# Patient Record
Sex: Male | Born: 1970 | Race: Black or African American | Hispanic: No | Marital: Single | Smoking: Current every day smoker
Health system: Southern US, Community
[De-identification: ages and names within clinical notes are randomized; demographics above are authoritative.]

---

## 2000-12-16 ENCOUNTER — Emergency Department (HOSPITAL_COMMUNITY): Admission: EM | Admit: 2000-12-16 | Discharge: 2000-12-16 | Payer: Self-pay | Admitting: Internal Medicine

## 2000-12-21 ENCOUNTER — Emergency Department (HOSPITAL_COMMUNITY): Admission: EM | Admit: 2000-12-21 | Discharge: 2000-12-21 | Payer: Self-pay | Admitting: Emergency Medicine

## 2000-12-21 ENCOUNTER — Encounter: Payer: Self-pay | Admitting: Emergency Medicine

## 2001-03-01 ENCOUNTER — Emergency Department (HOSPITAL_COMMUNITY): Admission: EM | Admit: 2001-03-01 | Discharge: 2001-03-01 | Payer: Self-pay | Admitting: Emergency Medicine

## 2001-09-12 ENCOUNTER — Emergency Department (HOSPITAL_COMMUNITY): Admission: EM | Admit: 2001-09-12 | Discharge: 2001-09-12 | Payer: Self-pay | Admitting: *Deleted

## 2001-11-21 ENCOUNTER — Emergency Department (HOSPITAL_COMMUNITY): Admission: EM | Admit: 2001-11-21 | Discharge: 2001-11-22 | Payer: Self-pay | Admitting: Emergency Medicine

## 2002-02-06 ENCOUNTER — Encounter: Payer: Self-pay | Admitting: Emergency Medicine

## 2002-02-06 ENCOUNTER — Emergency Department (HOSPITAL_COMMUNITY): Admission: EM | Admit: 2002-02-06 | Discharge: 2002-02-06 | Payer: Self-pay | Admitting: Emergency Medicine

## 2002-02-06 ENCOUNTER — Encounter: Payer: Self-pay | Admitting: Orthopedic Surgery

## 2014-07-24 ENCOUNTER — Emergency Department (HOSPITAL_COMMUNITY): Payer: No Typology Code available for payment source

## 2014-07-24 ENCOUNTER — Inpatient Hospital Stay (HOSPITAL_COMMUNITY)
Admission: EM | Admit: 2014-07-24 | Discharge: 2014-07-28 | DRG: 085 | Disposition: A | Discharge status: Other Acute Inpatient Hospital | Payer: No Typology Code available for payment source | Attending: Surgery | Admitting: Surgery

## 2014-07-24 ENCOUNTER — Encounter (HOSPITAL_COMMUNITY): Payer: Self-pay | Admitting: Emergency Medicine

## 2014-07-24 DIAGNOSIS — F17210 Nicotine dependence, cigarettes, uncomplicated: Secondary | ICD-10-CM | POA: Diagnosis present

## 2014-07-24 DIAGNOSIS — S0591XA Unspecified injury of right eye and orbit, initial encounter: Secondary | ICD-10-CM

## 2014-07-24 DIAGNOSIS — S0219XB Other fracture of base of skull, initial encounter for open fracture: Secondary | ICD-10-CM | POA: Diagnosis present

## 2014-07-24 DIAGNOSIS — S0292XA Unspecified fracture of facial bones, initial encounter for closed fracture: Secondary | ICD-10-CM | POA: Diagnosis present

## 2014-07-24 DIAGNOSIS — S02401B Maxillary fracture, unspecified, initial encounter for open fracture: Secondary | ICD-10-CM | POA: Diagnosis present

## 2014-07-24 DIAGNOSIS — S066X0A Traumatic subarachnoid hemorrhage without loss of consciousness, initial encounter: Principal | ICD-10-CM | POA: Diagnosis present

## 2014-07-24 DIAGNOSIS — S0285XB Fracture of orbit, unspecified, initial encounter for open fracture: Secondary | ICD-10-CM

## 2014-07-24 DIAGNOSIS — S0292XB Unspecified fracture of facial bones, initial encounter for open fracture: Secondary | ICD-10-CM

## 2014-07-24 DIAGNOSIS — Z1889 Other specified retained foreign body fragments: Secondary | ICD-10-CM

## 2014-07-24 DIAGNOSIS — G960 Cerebrospinal fluid leak: Secondary | ICD-10-CM | POA: Diagnosis present

## 2014-07-24 DIAGNOSIS — W3400XA Accidental discharge from unspecified firearms or gun, initial encounter: Secondary | ICD-10-CM

## 2014-07-24 DIAGNOSIS — S0541XA Penetrating wound of orbit with or without foreign body, right eye, initial encounter: Secondary | ICD-10-CM | POA: Diagnosis present

## 2014-07-24 DIAGNOSIS — Z4659 Encounter for fitting and adjustment of other gastrointestinal appliance and device: Secondary | ICD-10-CM

## 2014-07-24 DIAGNOSIS — S023XXB Fracture of orbital floor, initial encounter for open fracture: Secondary | ICD-10-CM | POA: Diagnosis present

## 2014-07-24 DIAGNOSIS — S020XXA Fracture of vault of skull, initial encounter for closed fracture: Secondary | ICD-10-CM | POA: Diagnosis present

## 2014-07-24 DIAGNOSIS — S061X0A Traumatic cerebral edema without loss of consciousness, initial encounter: Secondary | ICD-10-CM | POA: Diagnosis present

## 2014-07-24 DIAGNOSIS — F329 Major depressive disorder, single episode, unspecified: Secondary | ICD-10-CM | POA: Diagnosis not present

## 2014-07-24 DIAGNOSIS — F419 Anxiety disorder, unspecified: Secondary | ICD-10-CM | POA: Diagnosis not present

## 2014-07-24 DIAGNOSIS — H0551 Retained (old) foreign body following penetrating wound of right orbit: Secondary | ICD-10-CM | POA: Diagnosis present

## 2014-07-24 DIAGNOSIS — S0291XA Unspecified fracture of skull, initial encounter for closed fracture: Secondary | ICD-10-CM

## 2014-07-24 DIAGNOSIS — J9600 Acute respiratory failure, unspecified whether with hypoxia or hypercapnia: Secondary | ICD-10-CM | POA: Diagnosis present

## 2014-07-24 DIAGNOSIS — S069XAA Unspecified intracranial injury with loss of consciousness status unknown, initial encounter: Secondary | ICD-10-CM | POA: Diagnosis present

## 2014-07-24 DIAGNOSIS — S069X9A Unspecified intracranial injury with loss of consciousness of unspecified duration, initial encounter: Secondary | ICD-10-CM | POA: Diagnosis present

## 2014-07-24 DIAGNOSIS — S02402A Zygomatic fracture, unspecified, initial encounter for closed fracture: Secondary | ICD-10-CM | POA: Diagnosis present

## 2014-07-24 DIAGNOSIS — T1490XA Injury, unspecified, initial encounter: Secondary | ICD-10-CM

## 2014-07-24 LAB — COMPREHENSIVE METABOLIC PANEL
ALBUMIN: 4.3 g/dL (ref 3.5–5.2)
ALK PHOS: 67 U/L (ref 39–117)
ALT: 18 U/L (ref 0–53)
AST: 21 U/L (ref 0–37)
Anion gap: 19 — ABNORMAL HIGH (ref 5–15)
BUN: 13 mg/dL (ref 6–23)
CHLORIDE: 99 meq/L (ref 96–112)
CO2: 22 mEq/L (ref 19–32)
Calcium: 9.4 mg/dL (ref 8.4–10.5)
Creatinine, Ser: 1.06 mg/dL (ref 0.50–1.35)
GFR calc Af Amer: >90 mL/min (ref >90)
GFR calc non Af Amer: 84 mL/min — ABNORMAL LOW (ref >90)
Glucose, Bld: 147 mg/dL — ABNORMAL HIGH (ref 70–99)
Potassium: 3 mEq/L — ABNORMAL LOW (ref 3.7–5.3)
Sodium: 140 mEq/L (ref 137–147)
Total Bilirubin: 0.3 mg/dL (ref 0.3–1.2)
Total Protein: 8 g/dL (ref 6.0–8.3)

## 2014-07-24 LAB — CBC
HEMATOCRIT: 40.7 % (ref 39.0–52.0)
Hemoglobin: 14.7 g/dL (ref 13.0–17.0)
MCH: 32.4 pg (ref 26.0–34.0)
MCHC: 36.1 g/dL — AB (ref 30.0–36.0)
MCV: 89.6 fL (ref 78.0–100.0)
Platelets: 273 10*3/uL (ref 150–400)
RBC: 4.54 MIL/uL (ref 4.22–5.81)
RDW: 12.6 % (ref 11.5–15.5)
WBC: 14.5 10*3/uL — AB (ref 4.0–10.5)

## 2014-07-24 LAB — SAMPLE TO BLOOD BANK

## 2014-07-24 LAB — ETHANOL: Alcohol, Ethyl (B): <11 mg/dL (ref 0–11)

## 2014-07-24 LAB — PROTIME-INR
INR: 0.97 (ref 0.00–1.49)
Prothrombin Time: 13 seconds (ref 11.6–15.2)

## 2014-07-24 MED ORDER — ROCURONIUM BROMIDE 50 MG/5ML IV SOLN
INTRAVENOUS | Status: AC
  Filled 2014-07-24: qty 2

## 2014-07-24 MED ORDER — LIDOCAINE HCL (CARDIAC) 20 MG/ML IV SOLN
INTRAVENOUS | Status: AC
  Filled 2014-07-24: qty 5

## 2014-07-24 MED ORDER — MANNITOL 25 % IV SOLN
12.5000 g | Freq: Once | INTRAVENOUS | Status: DC
  Filled 2014-07-24: qty 50

## 2014-07-24 MED ORDER — SUCCINYLCHOLINE CHLORIDE 20 MG/ML IJ SOLN
INTRAMUSCULAR | Status: AC
  Filled 2014-07-24: qty 1

## 2014-07-24 MED ORDER — ATROPINE SULFATE 1 MG/ML IJ SOLN
1.0000 mg | Freq: Once | INTRAMUSCULAR | Status: AC
  Administered 2014-07-24: 1 mg via INTRAVENOUS

## 2014-07-24 MED ORDER — SUCCINYLCHOLINE CHLORIDE 20 MG/ML IJ SOLN
INTRAMUSCULAR | Status: AC
  Administered 2014-07-24: 200 mg
  Filled 2014-07-24: qty 1

## 2014-07-24 MED ORDER — ETOMIDATE 2 MG/ML IV SOLN
INTRAVENOUS | Status: AC
  Administered 2014-07-24: 20 mg
  Filled 2014-07-24: qty 20

## 2014-07-24 MED ORDER — NALOXONE HCL 1 MG/ML IJ SOLN
1.0000 mg | Freq: Once | INTRAMUSCULAR | Status: AC
  Administered 2014-07-24: 1 mg via INTRAVENOUS

## 2014-07-24 MED ORDER — ROCURONIUM BROMIDE 50 MG/5ML IV SOLN
INTRAVENOUS | Status: AC
  Administered 2014-07-24: 100 mg
  Filled 2014-07-24: qty 2
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
## 2014-07-25 ENCOUNTER — Encounter (HOSPITAL_COMMUNITY): Payer: Self-pay | Admitting: Emergency Medicine

## 2014-07-25 ENCOUNTER — Inpatient Hospital Stay (HOSPITAL_COMMUNITY): Payer: No Typology Code available for payment source

## 2014-07-25 DIAGNOSIS — S069X9A Unspecified intracranial injury with loss of consciousness of unspecified duration, initial encounter: Secondary | ICD-10-CM | POA: Diagnosis present

## 2014-07-25 DIAGNOSIS — W3400XA Accidental discharge from unspecified firearms or gun, initial encounter: Secondary | ICD-10-CM

## 2014-07-25 DIAGNOSIS — S069XAA Unspecified intracranial injury with loss of consciousness status unknown, initial encounter: Secondary | ICD-10-CM | POA: Diagnosis present

## 2014-07-25 DIAGNOSIS — J9600 Acute respiratory failure, unspecified whether with hypoxia or hypercapnia: Secondary | ICD-10-CM | POA: Diagnosis present

## 2014-07-25 DIAGNOSIS — S0292XA Unspecified fracture of facial bones, initial encounter for closed fracture: Secondary | ICD-10-CM | POA: Diagnosis present

## 2014-07-25 LAB — URINALYSIS, ROUTINE W REFLEX MICROSCOPIC
Bilirubin Urine: NEGATIVE
Glucose, UA: 100 mg/dL — AB
Hgb urine dipstick: NEGATIVE
Ketones, ur: NEGATIVE mg/dL
LEUKOCYTES UA: NEGATIVE
NITRITE: NEGATIVE
Protein, ur: NEGATIVE mg/dL
SPECIFIC GRAVITY, URINE: 1.007 (ref 1.005–1.030)
Urobilinogen, UA: 0.2 mg/dL (ref 0.0–1.0)
pH: 7.5 (ref 5.0–8.0)

## 2014-07-25 LAB — MRSA PCR SCREENING: MRSA by PCR: NEGATIVE

## 2014-07-25 LAB — TYPE AND SCREEN
ABO/RH(D): B POS
ANTIBODY SCREEN: NEGATIVE
UNIT DIVISION: 0
Unit division: 0

## 2014-07-25 LAB — CBC
HCT: 39.8 % (ref 39.0–52.0)
Hemoglobin: 13.8 g/dL (ref 13.0–17.0)
MCH: 30.8 pg (ref 26.0–34.0)
MCHC: 34.7 g/dL (ref 30.0–36.0)
MCV: 88.8 fL (ref 78.0–100.0)
PLATELETS: 252 10*3/uL (ref 150–400)
RBC: 4.48 MIL/uL (ref 4.22–5.81)
RDW: 12.6 % (ref 11.5–15.5)
WBC: 21.8 10*3/uL — ABNORMAL HIGH (ref 4.0–10.5)

## 2014-07-25 LAB — PREPARE FRESH FROZEN PLASMA
UNIT DIVISION: 0
Unit division: 0

## 2014-07-25 LAB — I-STAT ARTERIAL BLOOD GAS, ED
ACID-BASE EXCESS: 4 mmol/L — AB (ref 0.0–2.0)
Bicarbonate: 27.6 mEq/L — ABNORMAL HIGH (ref 20.0–24.0)
O2 SAT: 100 %
TCO2: 29 mmol/L (ref 0–100)
pCO2 arterial: 38.4 mmHg (ref 35.0–45.0)
pH, Arterial: 7.465 — ABNORMAL HIGH (ref 7.350–7.450)
pO2, Arterial: 483 mmHg — ABNORMAL HIGH (ref 80.0–100.0)

## 2014-07-25 LAB — COMPREHENSIVE METABOLIC PANEL
ALBUMIN: 4.1 g/dL (ref 3.5–5.2)
ALT: 16 U/L (ref 0–53)
AST: 25 U/L (ref 0–37)
Alkaline Phosphatase: 60 U/L (ref 39–117)
Anion gap: 14 (ref 5–15)
BUN: 12 mg/dL (ref 6–23)
CALCIUM: 8.6 mg/dL (ref 8.4–10.5)
CO2: 25 mEq/L (ref 19–32)
CREATININE: 1.04 mg/dL (ref 0.50–1.35)
Chloride: 102 mEq/L (ref 96–112)
GFR calc Af Amer: >90 mL/min (ref >90)
GFR calc non Af Amer: 86 mL/min — ABNORMAL LOW (ref >90)
Glucose, Bld: 114 mg/dL — ABNORMAL HIGH (ref 70–99)
Potassium: 3.6 mEq/L — ABNORMAL LOW (ref 3.7–5.3)
SODIUM: 141 meq/L (ref 137–147)
TOTAL PROTEIN: 7.5 g/dL (ref 6.0–8.3)
Total Bilirubin: 0.7 mg/dL (ref 0.3–1.2)

## 2014-07-25 LAB — LACTIC ACID, PLASMA: LACTIC ACID, VENOUS: 1.5 mmol/L (ref 0.5–2.2)

## 2014-07-25 LAB — CDS SEROLOGY

## 2014-07-25 LAB — ABO/RH: ABO/RH(D): B POS

## 2014-07-25 MED ORDER — FENTANYL CITRATE 0.05 MG/ML IJ SOLN
100.0000 ug | Freq: Once | INTRAMUSCULAR | Status: AC
  Administered 2014-07-25: 100 ug via INTRAVENOUS

## 2014-07-25 MED ORDER — MIDAZOLAM HCL 2 MG/2ML IJ SOLN
INTRAMUSCULAR | Status: AC
  Filled 2014-07-25: qty 2

## 2014-07-25 MED ORDER — PROPOFOL 10 MG/ML IV EMUL: 5.0000 ug/kg/min | INTRAVENOUS | Status: DC

## 2014-07-25 MED ORDER — CHLORHEXIDINE GLUCONATE 0.12 % MT SOLN
15.0000 mL | Freq: Two times a day (BID) | OROMUCOSAL | Status: DC
  Administered 2014-07-25 – 2014-07-26 (×3): 15 mL via OROMUCOSAL
  Filled 2014-07-25 (×3): qty 15

## 2014-07-25 MED ORDER — CEFAZOLIN SODIUM-DEXTROSE 2-3 GM-% IV SOLR
2.0000 g | Freq: Once | INTRAVENOUS | Status: AC
  Administered 2014-07-25: 2 g via INTRAVENOUS

## 2014-07-25 MED ORDER — PANTOPRAZOLE SODIUM 40 MG IV SOLR
40.0000 mg | INTRAVENOUS | Status: DC
  Administered 2014-07-25 – 2014-07-28 (×4): 40 mg via INTRAVENOUS
  Filled 2014-07-25 (×3): qty 40

## 2014-07-25 MED ORDER — PROPOFOL 10 MG/ML IV BOLUS
50.0000 mg | Freq: Once | INTRAVENOUS | Status: AC
  Administered 2014-07-25: 50 mg via INTRAVENOUS

## 2014-07-25 MED ORDER — LEVETIRACETAM 100 MG/ML PO SOLN
500.0000 mg | Freq: Two times a day (BID) | ORAL | Status: DC
  Administered 2014-07-25 – 2014-07-28 (×6): 500 mg via ORAL
  Filled 2014-07-25 (×7): qty 5

## 2014-07-25 MED ORDER — FENTANYL CITRATE 0.05 MG/ML IJ SOLN
INTRAMUSCULAR | Status: AC
  Filled 2014-07-25: qty 2

## 2014-07-25 MED ORDER — DEXTROSE-NACL 5-0.9 % IV SOLN
INTRAVENOUS | Status: DC
  Administered 2014-07-25: 03:00:00 via INTRAVENOUS
  Administered 2014-07-26: 100 mL/h via INTRAVENOUS
  Administered 2014-07-27 (×2): via INTRAVENOUS

## 2014-07-25 MED ORDER — CETYLPYRIDINIUM CHLORIDE 0.05 % MT LIQD
7.0000 mL | Freq: Four times a day (QID) | OROMUCOSAL | Status: DC
  Administered 2014-07-25 – 2014-07-26 (×6): 7 mL via OROMUCOSAL

## 2014-07-25 MED ORDER — CEFAZOLIN SODIUM-DEXTROSE 2-3 GM-% IV SOLR
2.0000 g | Freq: Three times a day (TID) | INTRAVENOUS | Status: DC
  Administered 2014-07-25 – 2014-07-28 (×9): 2 g via INTRAVENOUS
  Filled 2014-07-25 (×16): qty 50

## 2014-07-25 MED ORDER — MIDAZOLAM HCL 2 MG/2ML IJ SOLN
4.0000 mg | Freq: Once | INTRAMUSCULAR | Status: AC
  Administered 2014-07-25: 4 mg via INTRAVENOUS

## 2014-07-25 MED ORDER — LEVETIRACETAM IN NACL 1000 MG/100ML IV SOLN
1000.0000 mg | INTRAVENOUS | Status: AC
  Administered 2014-07-25: 1000 mg via INTRAVENOUS
  Filled 2014-07-25: qty 100

## 2014-07-25 MED ORDER — PROPOFOL 10 MG/ML IV EMUL
INTRAVENOUS | Status: AC
  Administered 2014-07-25: 1000 mg via INTRAVENOUS
  Filled 2014-07-25: qty 100

## 2014-07-25 MED ORDER — CEFAZOLIN SODIUM-DEXTROSE 2-3 GM-% IV SOLR
INTRAVENOUS | Status: AC
  Filled 2014-07-25: qty 50

## 2014-07-25 MED ORDER — PROPOFOL 10 MG/ML IV EMUL
5.0000 ug/kg/min | INTRAVENOUS | Status: DC
  Administered 2014-07-25: 70 ug/kg/min via INTRAVENOUS
  Administered 2014-07-25: 50 ug/kg/min via INTRAVENOUS
  Administered 2014-07-25: 1000 mg via INTRAVENOUS
  Administered 2014-07-25 – 2014-07-26 (×10): 70 ug/kg/min via INTRAVENOUS
  Filled 2014-07-25 (×14): qty 100

## 2014-07-25 MED ORDER — HYDROMORPHONE HCL 1 MG/ML IJ SOLN
1.0000 mg | INTRAMUSCULAR | Status: DC | PRN
  Administered 2014-07-25 – 2014-07-28 (×14): 1 mg via INTRAVENOUS
  Filled 2014-07-25 (×15): qty 1
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
## 2014-07-25 NOTE — Progress Notes (Signed)
VT was found changed by RT during vent check. Will check to see if MD changed and adjust accordingly. Previously 600, currently 660.
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
## 2014-07-25 NOTE — ED Notes (Signed)
MD at bedside. 
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
## 2014-07-25 NOTE — ED Notes (Signed)
Paged ENT/[place] @ [phone] to [facility]

## 2014-07-25 NOTE — Progress Notes (Signed)
Patient ID: [name], male   DOB: November 02, 1970, 43 y.o.   MRN: [id number] BP 131/81 mmHg  Pulse 93  Temp(Src) 96.6 F (35.9 C) (Core (Comment))  Resp 15  Ht 6' 2.5" (1.892 m)  Wt 104.327 kg (230 lb)  BMI 29.14 kg/m2  SpO2 97% Intubated sedated. According to nursing he was following commands earlier today since admission to ICU. I agree with ENT, that reconstruction is needed. However there is no imminent threat of further brain injury at this time. I do not believe craniotomy at this time without definitive plan for orbital reconstruction is necessary. I agree with transfer. Walter Long is stable for transfer, I will order a repeat ct since he remains intubated.

## 2014-07-25 NOTE — Progress Notes (Signed)
eLink Physician-Brief Progress Note Patient Name: Walter Long DOB: [birth year] MRN: [id number]   Date of Service  07/25/2014  HPI/Events of Note    eICU Interventions  Vent orders clarified DVT/SUP proph     Intervention Category Intermediate Interventions: Best-practice therapies (e.g. DVT, beta blocker, etc.)  Tatianna Ibbotson V. 07/25/2014, 3:03 AM
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
## 2014-07-25 NOTE — Progress Notes (Addendum)
[facility]plain [facility] to be with family. Chaplain sat with pt parents while [facility] was [facility]. Chaplain provided spiritual and emotional support to pt parents and was in attendance when the Emergency Physician spoke with pt parents. [facility].   Pt parents very religious, They will require spiritual care support immediately [facility].  Please call [facility] on call chaplain to support family as soon as possible after [facility] for follow-up treatment and care.  Walter Long, DMin Chaplain

## 2014-07-25 NOTE — ED Notes (Signed)
Pharmacy called and informed of need for keppra.

## 2014-07-25 NOTE — ED Notes (Signed)
Neurosurgeon at [facility].  
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
## 2014-07-25 NOTE — Progress Notes (Signed)
Chaplain referred  to pt family from on-call chaplain. Chaplain met family in lobby area and introduced herself and her services. Chaplain will continue to follow.   07/25/14 1100  Clinical Encounter Type  Visited With Family  Visit Type Initial;Spiritual support  Referral From [facility] [place] 07/25/2014 11:29 AM

## 2014-07-25 NOTE — ED Notes (Signed)
Pt intubated upon arrival to [facility], propofol running at 31.2 cc, NS bolus x2, [facility] adm 200 mcg of fentanyl as well.

## 2014-07-26 ENCOUNTER — Inpatient Hospital Stay (HOSPITAL_COMMUNITY): Payer: No Typology Code available for payment source

## 2014-07-26 ENCOUNTER — Encounter (HOSPITAL_COMMUNITY): Payer: Self-pay

## 2014-07-26 LAB — CBC WITH DIFFERENTIAL/PLATELET
BASOS PCT: 0 % (ref 0–1)
Basophils Absolute: 0 10*3/uL (ref 0.0–0.1)
EOS PCT: 1 % (ref 0–5)
Eosinophils Absolute: 0.1 10*3/uL (ref 0.0–0.7)
HEMATOCRIT: 36 % — AB (ref 39.0–52.0)
Hemoglobin: 12.5 g/dL — ABNORMAL LOW (ref 13.0–17.0)
Lymphocytes Relative: 24 % (ref 12–46)
Lymphs Abs: 3 10*3/uL (ref 0.7–4.0)
MCH: 31.3 pg (ref 26.0–34.0)
MCHC: 34.7 g/dL (ref 30.0–36.0)
MCV: 90 fL (ref 78.0–100.0)
MONO ABS: 1.7 10*3/uL — AB (ref 0.1–1.0)
Monocytes Relative: 13 % — ABNORMAL HIGH (ref 3–12)
Neutro Abs: 7.8 10*3/uL — ABNORMAL HIGH (ref 1.7–7.7)
Neutrophils Relative %: 62 % (ref 43–77)
PLATELETS: 204 10*3/uL (ref 150–400)
RBC: 4 MIL/uL — ABNORMAL LOW (ref 4.22–5.81)
RDW: 13 % (ref 11.5–15.5)
WBC: 12.7 10*3/uL — ABNORMAL HIGH (ref 4.0–10.5)

## 2014-07-26 LAB — BLOOD GAS, ARTERIAL
ACID-BASE EXCESS: 2.4 mmol/L — AB (ref 0.0–2.0)
Bicarbonate: 25.7 mEq/L — ABNORMAL HIGH (ref 20.0–24.0)
DRAWN BY: 331471
FIO2: 0.4 %
O2 Saturation: 98.6 %
PEEP: 0.5 cmH2O
PH ART: 7.48 — AB (ref 7.350–7.450)
PO2 ART: 110 mmHg — AB (ref 80.0–100.0)
Patient temperature: 98.6
RATE: 15 resp/min
TCO2: 26.8 mmol/L (ref 0–100)
VT: 660 mL
pCO2 arterial: 34.9 mmHg — ABNORMAL LOW (ref 35.0–45.0)

## 2014-07-26 LAB — BASIC METABOLIC PANEL
ANION GAP: 9 (ref 5–15)
BUN: 8 mg/dL (ref 6–23)
CALCIUM: 8.2 mg/dL — AB (ref 8.4–10.5)
CO2: 27 meq/L (ref 19–32)
CREATININE: 1.11 mg/dL (ref 0.50–1.35)
Chloride: 105 mEq/L (ref 96–112)
GFR calc Af Amer: >90 mL/min (ref >90)
GFR calc non Af Amer: 80 mL/min — ABNORMAL LOW (ref >90)
Glucose, Bld: 218 mg/dL — ABNORMAL HIGH (ref 70–99)
Potassium: 3.4 mEq/L — ABNORMAL LOW (ref 3.7–5.3)
Sodium: 141 mEq/L (ref 137–147)

## 2014-07-26 NOTE — Procedures (Signed)
Extubation Procedure Note  Patient Details:   Name: Walter Long DOB: 1971/02/14 MRN: [id number]   Airway Documentation:     Evaluation  O2 sats: stable throughout Complications: No apparent complications Patient did tolerate procedure well. Bilateral Breath Sounds: Rhonchi Suctioning: Oral, Airway No  Walter Long, Walter Long, Walter Long
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
## 2014-07-26 NOTE — Progress Notes (Signed)
Given patient 1mg  Dilaudid x2 today and retrieved from 1mg  vials from pyxis. Nothing to waste with witness.
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
## 2014-07-27 ENCOUNTER — Inpatient Hospital Stay (HOSPITAL_COMMUNITY): Payer: No Typology Code available for payment source

## 2014-07-27 LAB — URINE MICROSCOPIC-ADD ON

## 2014-07-27 LAB — URINALYSIS, ROUTINE W REFLEX MICROSCOPIC
Bilirubin Urine: NEGATIVE
Glucose, UA: NEGATIVE mg/dL
Ketones, ur: NEGATIVE mg/dL
Nitrite: NEGATIVE
Protein, ur: 30 mg/dL — AB
Specific Gravity, Urine: 1.021 (ref 1.005–1.030)
Urobilinogen, UA: 0.2 mg/dL (ref 0.0–1.0)
pH: 7 (ref 5.0–8.0)

## 2014-07-27 LAB — GLUCOSE, CAPILLARY: Glucose-Capillary: 129 mg/dL — ABNORMAL HIGH (ref 70–99)

## 2014-07-27 MED ORDER — METOPROLOL TARTRATE 1 MG/ML IV SOLN: 5.0000 mg | Freq: Four times a day (QID) | INTRAVENOUS | Status: DC | PRN

## 2014-07-27 MED ORDER — LORAZEPAM 1 MG PO TABS
1.0000 mg | ORAL_TABLET | Freq: Three times a day (TID) | ORAL | Status: DC | PRN
  Administered 2014-07-27: 1 mg via ORAL
  Filled 2014-07-27: qty 1

## 2014-07-27 NOTE — Progress Notes (Signed)
Patient transferred to room 3W12.  Called patient's mother to inform her of the transfer.  Answering machine answered, no message left.
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
## 2014-07-28 ENCOUNTER — Encounter (HOSPITAL_COMMUNITY): Payer: Self-pay | Admitting: Ophthalmology

## 2014-07-28 LAB — GLUCOSE, CAPILLARY
Glucose-Capillary: 106 mg/dL — ABNORMAL HIGH (ref 70–99)
Glucose-Capillary: 124 mg/dL — ABNORMAL HIGH (ref 70–99)

## 2014-07-28 NOTE — Progress Notes (Signed)
[facility] Pt going to 2110. IV left In, RN aware. Pt girlfriend to travel with him. Cecille Rubinhompson,Eldora Napp V, RN
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
## 2014-07-28 NOTE — Discharge Instructions (Signed)
[facility]
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
## 2014-07-28 NOTE — Plan of Care (Signed)
Problem: Consults Goal: General Medical Patient Education See Patient Education Module for specific education. Outcome: Completed/Met Date Met:  07/28/14 Goal: Skin Care Protocol Initiated - if Braden Score 18 or less If consults are not indicated, leave blank or document N/A Outcome: Completed/Met Date Met:  07/28/14 Goal: Diabetes Guidelines if Diabetic/Glucose > 140 If diabetic or lab glucose is > 140 mg/dl - Initiate Diabetes/Hyperglycemia Guidelines & Document Interventions  Outcome: Not Applicable Date Met:  75/30/10  Problem: Phase I Progression Outcomes Goal: Pain controlled with appropriate interventions Outcome: Completed/Met Date Met:  07/28/14 Goal: Voiding-avoid urinary catheter unless indicated Outcome: Completed/Met Date Met:  07/28/14 Goal: Hemodynamically stable Outcome: Completed/Met Date Met:  07/28/14
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
## 2014-07-28 NOTE — Progress Notes (Signed)
[facility]. Pt still doesn't have a bed available. Confirmed that pt was waiting for a stepdown bed and not an ICU bed. Patient placement confirmed. Also updated them with unit phone number as pt transferred last night from [facility] to notify when a bed comes available. Pt's RN updated of the above
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
## 2016-12-30 ENCOUNTER — Encounter (HOSPITAL_COMMUNITY): Payer: Self-pay | Admitting: *Deleted

## 2016-12-30 ENCOUNTER — Emergency Department (HOSPITAL_COMMUNITY)
Admission: EM | Admit: 2016-12-30 | Discharge: 2016-12-30 | Disposition: A | Discharge status: Home / Self Care | Payer: Medicaid Other | Attending: Emergency Medicine | Admitting: Emergency Medicine

## 2016-12-30 DIAGNOSIS — F17210 Nicotine dependence, cigarettes, uncomplicated: Secondary | ICD-10-CM | POA: Insufficient documentation

## 2016-12-30 DIAGNOSIS — R220 Localized swelling, mass and lump, head: Secondary | ICD-10-CM | POA: Diagnosis present

## 2016-12-30 DIAGNOSIS — K047 Periapical abscess without sinus: Secondary | ICD-10-CM | POA: Diagnosis not present

## 2016-12-30 MED ORDER — HYDROCODONE-ACETAMINOPHEN 5-325 MG PO TABS: 1.0000 | ORAL_TABLET | ORAL | 0 refills | Status: AC | PRN

## 2016-12-30 MED ORDER — PENICILLIN V POTASSIUM 500 MG PO TABS: 500.0000 mg | ORAL_TABLET | Freq: Three times a day (TID) | ORAL | 0 refills | Status: AC

## 2016-12-30 MED ORDER — PENICILLIN V POTASSIUM 250 MG PO TABS
500.0000 mg | ORAL_TABLET | Freq: Once | ORAL | Status: AC
  Administered 2016-12-30: 500 mg via ORAL
  Filled 2016-12-30: qty 2

## 2016-12-30 MED ORDER — IBUPROFEN 400 MG PO TABS
600.0000 mg | ORAL_TABLET | Freq: Once | ORAL | Status: AC
  Administered 2016-12-30: 600 mg via ORAL
  Filled 2016-12-30: qty 1

## 2016-12-30 MED ORDER — IBUPROFEN 600 MG PO TABS: 600.0000 mg | ORAL_TABLET | Freq: Four times a day (QID) | ORAL | 0 refills | Status: AC | PRN

## 2016-12-30 NOTE — ED Triage Notes (Signed)
The pt has swelling and pain in his rt lower jaw  He reports that his gums are swollen  More swollen today
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
## 2019-06-17 ENCOUNTER — Emergency Department (HOSPITAL_COMMUNITY)
Admission: EM | Admit: 2019-06-17 | Discharge: 2019-06-17 | Disposition: A | Discharge status: Home / Self Care | Payer: Medicaid Other | Attending: Emergency Medicine | Admitting: Emergency Medicine

## 2019-06-17 ENCOUNTER — Other Ambulatory Visit: Payer: Self-pay

## 2019-06-17 DIAGNOSIS — S39012A Strain of muscle, fascia and tendon of lower back, initial encounter: Secondary | ICD-10-CM | POA: Diagnosis not present

## 2019-06-17 DIAGNOSIS — Y999 Unspecified external cause status: Secondary | ICD-10-CM | POA: Diagnosis not present

## 2019-06-17 DIAGNOSIS — Y93I9 Activity, other involving external motion: Secondary | ICD-10-CM | POA: Diagnosis not present

## 2019-06-17 DIAGNOSIS — G44319 Acute post-traumatic headache, not intractable: Secondary | ICD-10-CM | POA: Insufficient documentation

## 2019-06-17 DIAGNOSIS — Y92414 Local residential or business street as the place of occurrence of the external cause: Secondary | ICD-10-CM | POA: Insufficient documentation

## 2019-06-17 DIAGNOSIS — F17210 Nicotine dependence, cigarettes, uncomplicated: Secondary | ICD-10-CM | POA: Diagnosis not present

## 2019-06-17 DIAGNOSIS — S161XXA Strain of muscle, fascia and tendon at neck level, initial encounter: Secondary | ICD-10-CM | POA: Diagnosis not present

## 2019-06-17 DIAGNOSIS — Z79899 Other long term (current) drug therapy: Secondary | ICD-10-CM | POA: Insufficient documentation

## 2019-06-17 MED ORDER — METHOCARBAMOL 500 MG PO TABS: 500.0000 mg | ORAL_TABLET | Freq: Two times a day (BID) | ORAL | 0 refills | Status: AC
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
## 2019-06-17 NOTE — ED Notes (Signed)
Pt would not stay for dc vitals.
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
## 2019-06-17 NOTE — ED Notes (Signed)
Patient Alert and oriented to baseline. Stable and ambulatory to baseline. Patient verbalized understanding of the discharge instructions.  Patient belongings were taken by the patient.   

## 2019-06-17 NOTE — Discharge Instructions (Signed)
Take NSAIDs or Tylenol as needed for the next week. Take this medicine with food. °Take muscle relaxer at bedtime to help you sleep. This medicine makes you drowsy so do not take before driving or work °Use a heating pad for sore muscles - use for 20 minutes several times a day °Return for worsening symptoms ° °

## 2021-02-11 ENCOUNTER — Other Ambulatory Visit: Payer: Self-pay | Admitting: Internal Medicine

## 2021-02-12 LAB — COMPLETE METABOLIC PANEL WITH GFR
AG Ratio: 1.6 (calc) (ref 1.0–2.5)
ALT: 14 U/L (ref 9–46)
AST: 20 U/L (ref 10–40)
Albumin: 4.6 g/dL (ref 3.6–5.1)
Alkaline phosphatase (APISO): 69 U/L (ref 36–130)
BUN: 15 mg/dL (ref 7–25)
CO2: 24 mmol/L (ref 20–32)
Calcium: 9.3 mg/dL (ref 8.6–10.3)
Chloride: 102 mmol/L (ref 98–110)
Creat: 0.98 mg/dL (ref 0.60–1.35)
GFR, Est African American: 104 mL/min/{1.73_m2} (ref >=60)
GFR, Est Non African American: 90 mL/min/{1.73_m2} (ref >=60)
Globulin: 2.9 g/dL (calc) (ref 1.9–3.7)
Glucose, Bld: 123 mg/dL — ABNORMAL HIGH (ref 65–99)
Potassium: 3.9 mmol/L (ref 3.5–5.3)
Sodium: 139 mmol/L (ref 135–146)
Total Bilirubin: 0.8 mg/dL (ref 0.2–1.2)
Total Protein: 7.5 g/dL (ref 6.1–8.1)

## 2021-02-12 LAB — LIPID PANEL
Cholesterol: 149 mg/dL (ref <200)
HDL: 51 mg/dL (ref >=40)
LDL Cholesterol (Calc): 81 mg/dL (calc)
Non-HDL Cholesterol (Calc): 98 mg/dL (calc) (ref <130)
Total CHOL/HDL Ratio: 2.9 (calc) (ref <5.0)
Triglycerides: 86 mg/dL (ref <150)

## 2021-02-12 LAB — VITAMIN D 25 HYDROXY (VIT D DEFICIENCY, FRACTURES): Vit D, 25-Hydroxy: 18 ng/mL — ABNORMAL LOW (ref 30–100)

## 2021-02-12 LAB — CBC
HCT: 44 % (ref 38.5–50.0)
Hemoglobin: 15.6 g/dL (ref 13.2–17.1)
MCH: 32.4 pg (ref 27.0–33.0)
MCHC: 35.5 g/dL (ref 32.0–36.0)
MCV: 91.5 fL (ref 80.0–100.0)
MPV: 9.9 fL (ref 7.5–12.5)
Platelets: 258 10*3/uL (ref 140–400)
RBC: 4.81 10*6/uL (ref 4.20–5.80)
RDW: 12.5 % (ref 11.0–15.0)
WBC: 10.1 10*3/uL (ref 3.8–10.8)

## 2021-02-12 LAB — PSA: PSA: 1.12 ng/mL (ref <=4.00)

## 2021-02-12 LAB — TSH: TSH: 1.37 mIU/L (ref 0.40–4.50)

## 2022-10-21 ENCOUNTER — Other Ambulatory Visit: Payer: Self-pay | Admitting: Internal Medicine

## 2022-10-27 LAB — TIQ-MISC

## 2022-10-27 LAB — VITAMIN D 25 HYDROXY (VIT D DEFICIENCY, FRACTURES): Vit D, 25-Hydroxy: 24 ng/mL — ABNORMAL LOW (ref 30–100)

## 2022-10-27 LAB — TSH: TSH: 2.13 mIU/L (ref 0.40–4.50)

## 2022-10-27 LAB — PSA: PSA: 0.65 ng/mL (ref <=4.00)
